# Patient Record
Sex: Female | Born: 1935 | Race: Black or African American | Hispanic: No | Smoking: Never smoker
Health system: Southern US, Community
[De-identification: ages and names within clinical notes are randomized; demographics above are authoritative.]

## PROBLEM LIST (undated history)

## (undated) DIAGNOSIS — I2510 Atherosclerotic heart disease of native coronary artery without angina pectoris: Secondary | ICD-10-CM

## (undated) DIAGNOSIS — F0390 Unspecified dementia without behavioral disturbance: Secondary | ICD-10-CM

## (undated) DIAGNOSIS — I10 Essential (primary) hypertension: Secondary | ICD-10-CM

## (undated) HISTORY — PX: CARDIAC CATHETERIZATION: SHX172

## (undated) HISTORY — PX: PEG TUBE PLACEMENT: SUR1034

---

## 2014-06-18 ENCOUNTER — Inpatient Hospital Stay (HOSPITAL_COMMUNITY)
Admission: AD | Admit: 2014-06-18 | Discharge: 2014-06-22 | DRG: 394 | Disposition: A | Discharge status: Skilled Nursing Facility | Payer: Medicare Other | Source: Other Acute Inpatient Hospital | Attending: Internal Medicine | Admitting: Internal Medicine

## 2014-06-18 DIAGNOSIS — Y833 Surgical operation with formation of external stoma as the cause of abnormal reaction of the patient, or of later complication, without mention of misadventure at the time of the procedure: Secondary | ICD-10-CM | POA: Diagnosis present

## 2014-06-18 DIAGNOSIS — Z7982 Long term (current) use of aspirin: Secondary | ICD-10-CM

## 2014-06-18 DIAGNOSIS — Z66 Do not resuscitate: Secondary | ICD-10-CM | POA: Diagnosis present

## 2014-06-18 DIAGNOSIS — D631 Anemia in chronic kidney disease: Secondary | ICD-10-CM | POA: Diagnosis present

## 2014-06-18 DIAGNOSIS — Z7401 Bed confinement status: Secondary | ICD-10-CM | POA: Diagnosis not present

## 2014-06-18 DIAGNOSIS — D649 Anemia, unspecified: Secondary | ICD-10-CM | POA: Diagnosis present

## 2014-06-18 DIAGNOSIS — F0391 Unspecified dementia with behavioral disturbance: Secondary | ICD-10-CM | POA: Diagnosis present

## 2014-06-18 DIAGNOSIS — Z01818 Encounter for other preprocedural examination: Secondary | ICD-10-CM

## 2014-06-18 DIAGNOSIS — F0390 Unspecified dementia without behavioral disturbance: Secondary | ICD-10-CM | POA: Diagnosis present

## 2014-06-18 DIAGNOSIS — N183 Chronic kidney disease, stage 3 (moderate): Secondary | ICD-10-CM | POA: Diagnosis present

## 2014-06-18 DIAGNOSIS — I129 Hypertensive chronic kidney disease with stage 1 through stage 4 chronic kidney disease, or unspecified chronic kidney disease: Secondary | ICD-10-CM | POA: Diagnosis present

## 2014-06-18 DIAGNOSIS — S30811A Abrasion of abdominal wall, initial encounter: Secondary | ICD-10-CM | POA: Diagnosis present

## 2014-06-18 DIAGNOSIS — M6240 Contracture of muscle, unspecified site: Secondary | ICD-10-CM | POA: Diagnosis present

## 2014-06-18 DIAGNOSIS — I2510 Atherosclerotic heart disease of native coronary artery without angina pectoris: Secondary | ICD-10-CM | POA: Diagnosis present

## 2014-06-18 DIAGNOSIS — E876 Hypokalemia: Secondary | ICD-10-CM | POA: Diagnosis not present

## 2014-06-18 DIAGNOSIS — Z79899 Other long term (current) drug therapy: Secondary | ICD-10-CM | POA: Diagnosis not present

## 2014-06-18 DIAGNOSIS — K9423 Gastrostomy malfunction: Secondary | ICD-10-CM

## 2014-06-18 DIAGNOSIS — N184 Chronic kidney disease, stage 4 (severe): Secondary | ICD-10-CM | POA: Diagnosis present

## 2014-06-18 DIAGNOSIS — Z4659 Encounter for fitting and adjustment of other gastrointestinal appliance and device: Secondary | ICD-10-CM

## 2014-06-18 DIAGNOSIS — E870 Hyperosmolality and hypernatremia: Secondary | ICD-10-CM | POA: Diagnosis present

## 2014-06-18 DIAGNOSIS — N189 Chronic kidney disease, unspecified: Secondary | ICD-10-CM | POA: Diagnosis present

## 2014-06-18 DIAGNOSIS — I10 Essential (primary) hypertension: Secondary | ICD-10-CM | POA: Diagnosis present

## 2014-06-18 HISTORY — DX: Unspecified dementia, unspecified severity, without behavioral disturbance, psychotic disturbance, mood disturbance, and anxiety: F03.90

## 2014-06-18 HISTORY — DX: Atherosclerotic heart disease of native coronary artery without angina pectoris: I25.10

## 2014-06-18 HISTORY — DX: Essential (primary) hypertension: I10

## 2014-06-18 NOTE — Progress Notes (Signed)
Pt new admit from [facility], pt came from SNF peg tube site leaking, MD aware waiting for new orders.

## 2014-06-19 ENCOUNTER — Inpatient Hospital Stay (HOSPITAL_COMMUNITY): Payer: Medicare Other

## 2014-06-19 ENCOUNTER — Encounter (HOSPITAL_COMMUNITY): Payer: Self-pay | Admitting: *Deleted

## 2014-06-19 DIAGNOSIS — D649 Anemia, unspecified: Secondary | ICD-10-CM

## 2014-06-19 DIAGNOSIS — I2510 Atherosclerotic heart disease of native coronary artery without angina pectoris: Secondary | ICD-10-CM | POA: Diagnosis present

## 2014-06-19 DIAGNOSIS — N189 Chronic kidney disease, unspecified: Secondary | ICD-10-CM | POA: Diagnosis present

## 2014-06-19 DIAGNOSIS — I10 Essential (primary) hypertension: Secondary | ICD-10-CM

## 2014-06-19 DIAGNOSIS — K9423 Gastrostomy malfunction: Principal | ICD-10-CM

## 2014-06-19 DIAGNOSIS — F0390 Unspecified dementia without behavioral disturbance: Secondary | ICD-10-CM | POA: Diagnosis present

## 2014-06-19 DIAGNOSIS — N183 Chronic kidney disease, stage 3 (moderate): Secondary | ICD-10-CM

## 2014-06-19 LAB — BASIC METABOLIC PANEL
Anion gap: 5 (ref 5–15)
BUN: 21 mg/dL (ref 6–23)
CO2: 31 mmol/L (ref 19–32)
Calcium: 8.7 mg/dL (ref 8.4–10.5)
Chloride: 110 mmol/L (ref 96–112)
Creatinine, Ser: 1.87 mg/dL — ABNORMAL HIGH (ref 0.50–1.10)
GFR calc Af Amer: 26 mL/min — ABNORMAL LOW (ref >90)
GFR calc non Af Amer: 23 mL/min — ABNORMAL LOW (ref >90)
Glucose, Bld: 99 mg/dL (ref 70–99)
POTASSIUM: 3.7 mmol/L (ref 3.5–5.1)
Sodium: 146 mmol/L — ABNORMAL HIGH (ref 135–145)

## 2014-06-19 LAB — COMPREHENSIVE METABOLIC PANEL
ALK PHOS: 47 U/L (ref 39–117)
ALT: 25 U/L (ref 0–35)
AST: 26 U/L (ref 0–37)
Albumin: 2.5 g/dL — ABNORMAL LOW (ref 3.5–5.2)
Anion gap: 4 — ABNORMAL LOW (ref 5–15)
BUN: 22 mg/dL (ref 6–23)
CHLORIDE: 112 mmol/L (ref 96–112)
CO2: 30 mmol/L (ref 19–32)
Calcium: 8.7 mg/dL (ref 8.4–10.5)
Creatinine, Ser: 1.87 mg/dL — ABNORMAL HIGH (ref 0.50–1.10)
GFR calc Af Amer: 26 mL/min — ABNORMAL LOW (ref >90)
GFR calc non Af Amer: 23 mL/min — ABNORMAL LOW (ref >90)
GLUCOSE: 101 mg/dL — AB (ref 70–99)
POTASSIUM: 3.8 mmol/L (ref 3.5–5.1)
Sodium: 146 mmol/L — ABNORMAL HIGH (ref 135–145)
Total Bilirubin: 0.6 mg/dL (ref 0.3–1.2)
Total Protein: 7 g/dL (ref 6.0–8.3)

## 2014-06-19 LAB — CBC WITH DIFFERENTIAL/PLATELET
BASOS ABS: 0 10*3/uL (ref 0.0–0.1)
Basophils Relative: 1 % (ref 0–1)
EOS ABS: 0.2 10*3/uL (ref 0.0–0.7)
EOS PCT: 4 % (ref 0–5)
HCT: 26.6 % — ABNORMAL LOW (ref 36.0–46.0)
Hemoglobin: 8.1 g/dL — ABNORMAL LOW (ref 12.0–15.0)
LYMPHS ABS: 1.6 10*3/uL (ref 0.7–4.0)
LYMPHS PCT: 32 % (ref 12–46)
MCH: 28.5 pg (ref 26.0–34.0)
MCHC: 30.5 g/dL (ref 30.0–36.0)
MCV: 93.7 fL (ref 78.0–100.0)
Monocytes Absolute: 0.4 10*3/uL (ref 0.1–1.0)
Monocytes Relative: 7 % (ref 3–12)
Neutro Abs: 2.8 10*3/uL (ref 1.7–7.7)
Neutrophils Relative %: 56 % (ref 43–77)
Platelets: 185 10*3/uL (ref 150–400)
RBC: 2.84 MIL/uL — AB (ref 3.87–5.11)
RDW: 14.4 % (ref 11.5–15.5)
WBC: 5 10*3/uL (ref 4.0–10.5)

## 2014-06-19 LAB — RETICULOCYTES
RBC.: 2.8 MIL/uL — AB (ref 3.87–5.11)
Retic Count, Absolute: 67.2 10*3/uL (ref 19.0–186.0)
Retic Ct Pct: 2.4 % (ref 0.4–3.1)

## 2014-06-19 LAB — CBC
HCT: 25.9 % — ABNORMAL LOW (ref 36.0–46.0)
Hemoglobin: 7.9 g/dL — ABNORMAL LOW (ref 12.0–15.0)
MCH: 28.3 pg (ref 26.0–34.0)
MCHC: 30.5 g/dL (ref 30.0–36.0)
MCV: 92.8 fL (ref 78.0–100.0)
Platelets: 194 10*3/uL (ref 150–400)
RBC: 2.79 MIL/uL — ABNORMAL LOW (ref 3.87–5.11)
RDW: 14.2 % (ref 11.5–15.5)
WBC: 4.8 10*3/uL (ref 4.0–10.5)

## 2014-06-19 LAB — TYPE AND SCREEN
ABO/RH(D): A POS
ANTIBODY SCREEN: NEGATIVE

## 2014-06-19 LAB — PROTIME-INR
INR: 1.02 (ref 0.00–1.49)
Prothrombin Time: 13.5 seconds (ref 11.6–15.2)

## 2014-06-19 LAB — GLUCOSE, CAPILLARY
Glucose-Capillary: 112 mg/dL — ABNORMAL HIGH (ref 70–99)
Glucose-Capillary: 112 mg/dL — ABNORMAL HIGH (ref 70–99)

## 2014-06-19 LAB — ABO/RH: ABO/RH(D): A POS

## 2014-06-19 MED ORDER — ACETAMINOPHEN 325 MG PO TABS: 650.0000 mg | ORAL_TABLET | Freq: Four times a day (QID) | ORAL | Status: DC | PRN

## 2014-06-19 MED ORDER — ACETAMINOPHEN 650 MG RE SUPP: 650.0000 mg | Freq: Four times a day (QID) | RECTAL | Status: DC | PRN

## 2014-06-19 MED ORDER — HEPARIN SODIUM (PORCINE) 5000 UNIT/ML IJ SOLN
5000.0000 [IU] | Freq: Three times a day (TID) | INTRAMUSCULAR | Status: DC
  Administered 2014-06-19 – 2014-06-20 (×4): 5000 [IU] via SUBCUTANEOUS
  Filled 2014-06-19 (×8): qty 1

## 2014-06-19 MED ORDER — ONDANSETRON HCL 4 MG PO TABS: 4.0000 mg | ORAL_TABLET | Freq: Four times a day (QID) | ORAL | Status: DC | PRN

## 2014-06-19 MED ORDER — ONDANSETRON HCL 4 MG/2ML IJ SOLN: 4.0000 mg | Freq: Four times a day (QID) | INTRAMUSCULAR | Status: DC | PRN

## 2014-06-19 MED ORDER — ASPIRIN 325 MG PO TABS
325.0000 mg | ORAL_TABLET | Freq: Every day | ORAL | Status: DC
  Administered 2014-06-19 – 2014-06-20 (×2): 325 mg
  Filled 2014-06-19 (×2): qty 1

## 2014-06-19 MED ORDER — HYDRALAZINE HCL 20 MG/ML IJ SOLN: 10.0000 mg | INTRAMUSCULAR | Status: DC | PRN

## 2014-06-19 MED ORDER — METOPROLOL TARTRATE 25 MG PO TABS
25.0000 mg | ORAL_TABLET | Freq: Two times a day (BID) | ORAL | Status: DC
  Administered 2014-06-19 – 2014-06-22 (×7): 25 mg via ORAL
  Filled 2014-06-19 (×8): qty 1

## 2014-06-19 MED ORDER — NEPRO/CARBSTEADY PO LIQD
1000.0000 mL | ORAL | Status: DC
  Administered 2014-06-19 – 2014-06-21 (×4): 1000 mL
  Filled 2014-06-19 (×7): qty 1000

## 2014-06-19 MED ORDER — DORZOLAMIDE HCL 2 % OP SOLN
1.0000 [drp] | Freq: Three times a day (TID) | OPHTHALMIC | Status: DC
  Administered 2014-06-19 – 2014-06-22 (×10): 1 [drp] via OPHTHALMIC
  Filled 2014-06-19 (×2): qty 10

## 2014-06-19 MED ORDER — CETYLPYRIDINIUM CHLORIDE 0.05 % MT LIQD
7.0000 mL | Freq: Two times a day (BID) | OROMUCOSAL | Status: DC
  Administered 2014-06-19 – 2014-06-22 (×6): 7 mL via OROMUCOSAL

## 2014-06-19 MED ORDER — DEXTROSE-NACL 5-0.45 % IV SOLN
INTRAVENOUS | Status: AC
  Administered 2014-06-19 (×2): via INTRAVENOUS

## 2014-06-19 NOTE — Progress Notes (Signed)
Pt refused to let me examine her at this time.  Service will check back tomorrow. Very combative and angry.

## 2014-06-19 NOTE — Progress Notes (Signed)
Patient Demographics  Kristin Dickson, is a 81 y.o. female, DOB - 12-21-1922, ZOX:[id number]  Admit date - 06/18/2014   Admitting Physician Dorothea Ogle, MD  Outpatient Primary MD for the patient is FALGUI,VICENTE T, MD  LOS - 1      Admission history of present illness/brief narrative:  Kristin Dickson is a 81 y.o. female with history of mild dementia, CAD, chronic kidney disease, hypertension, chronic anemia, hypernatremia, was originally admitted at [facility] for PEG tube displacement and had a new PEG tube placed which was found to have a leak and eventually GI was consulted and a J-tube was placed. Despite which patient  was still found to have lots of gastric leakage and at this time was referred to [facility] for further management of the gastric tube leakage and wound around the gastric tube site which may need new gastric tube site placement. Apparently on x-ray patient has G- J tube, were tube feedings going through the G part, and G were involved is on drainage with gravity.  Subjective:   Kristin Dickson patient is noncommunicative, does not answer questions.  Assessment & Plan    Principal Problem:   Leaking percutaneous endoscopic gastrostomy (PEG) tube Active Problems:   Essential hypertension   CAD (coronary artery disease)   CKD (chronic kidney disease)   Chronic anemia   Dementia   Leaking PEG tube  Leaking PEG tube: - Consulted surgical service. -We'll consult Wound Care for evaluation for surrounding excoriation at the site  Hypertension - Continue with metoprolol and when necessary IV hydralazine  Chronic anemia - Most likely due to anemia of chronic kidney disease - Check anemia panel  Chronic kidney disease stage III - Monitor BMP closely  Coronary artery disease - Continue with  aspirin  Dementia  Code Status: DO NOT RESUSCITATE  Family Communication: None at bedside  Disposition Plan: Awaiting surgical evaluation   Procedures None   Consults   None   Medications  Scheduled Meds: . antiseptic oral rinse  7 mL Mouth Rinse BID  . aspirin  325 mg Per Tube Daily  . dorzolamide  1 drop Both Eyes TID  . heparin  5,000 Units Subcutaneous 3 times per day  . metoprolol tartrate  25 mg Oral BID   Continuous Infusions: . dextrose 5 % and 0.45% NaCl 50 mL/hr at 06/19/14 0212  . feeding supplement (NEPRO CARB STEADY) 1,000 mL (06/19/14 0212)   PRN Meds:.acetaminophen **OR** acetaminophen, hydrALAZINE, ondansetron **OR** ondansetron (ZOFRAN) IV  DVT Prophylaxis  heparin  Lab Results  Component Value Date   PLT 185 06/19/2014   PLT 194 06/19/2014    Antibiotics    Anti-infectives    None          Objective:   Filed Vitals:   06/18/14 2200 06/19/14 1315  BP: 134/79 146/64  Pulse: 101 84  Temp: 97.7 F (36.5 C) 98.6 F (37 C)  TempSrc: Axillary Oral  Resp: 16 17  Height:  (1.6 m)   Weight: 62.823 kg (138 lb 8 oz)   SpO2: 92% 100%    Wt Readings from Last 3 Encounters:  06/18/14 62.823 kg (138 lb 8 oz)     Intake/Output Summary (Last 24 hours)  at 06/19/14 1449 Last data filed at 06/19/14 0600  Gross per 24 hour  Intake    311 ml  Output      0 ml  Net    311 ml     Physical Exam  Awake , noncommunicative  Supple Neck,No JVD, No cervical lymphadenopathy appriciated.  Symmetrical Chest wall movement, Good air movement bilaterally, CTAB No Gallops,Rubs or new Murmurs, No Parasternal Heave +ve B.Sounds, Abd Soft, No tenderness, No organomegaly appriciated, No rebound - guarding or rigidity, PEG tube present, no discharge from site Grapelandhomas gauze has been changed 2 hours ago, is related skin around PEG tube. No Cyanosis, Clubbing or edema, No new Rash or bruise     Data Review   Micro Results No results found for  this or any previous visit (from the past 240 hour(s)).  Radiology Reports Dg Abd 1 View  06/19/2014   CLINICAL DATA:  adjustment of gastrointestinal device. Initial encounter.  EXAM: ABDOMEN - 1 VIEW  COMPARISON:  None.  FINDINGS: There appears to be a percutaneous enteric tube which has a course from the region of the stomach, through the doudenal C loop and terminating in the right abdomen, presumably in small bowel.  There is oral contrast mixed with stool. Non obstructed bowel gas pattern. Two IVC filters are in place. Negative lung bases. No acute osseous abnormality identified.  IMPRESSION: 1. Enteric tubing favored to be a percutaneous G-J tube, coursing through the duodenum and terminating in the proximal small bowel in the right abdomen.  2.  Non obstructed bowel gas pattern.   Electronically Signed   By: Odessa FlemingH  Hall M.D.   On: 06/19/2014 02:24   Dg Chest Port 1 View  06/19/2014   CLINICAL DATA:  Pre-op respiratory exam. Coronary artery disease. Chronic kidney disease. Leaking gastrostomy tube.  EXAM: PORTABLE CHEST - 1 VIEW  COMPARISON:  None.  FINDINGS: Position of the patient's head obscures the right upper lung field. Lungs are otherwise clear. No evidence of pleural effusion. Heart size is within normal limits.  IMPRESSION: Suboptimal visualization of right upper lung field due to positioning. No acute findings.   Electronically Signed   By: Myles RosenthalJohn  Stahl M.D.   On: 06/19/2014 11:08    CBC  Recent Labs Lab 06/19/14 0234  WBC 4.8  5.0  HGB 7.9*  8.1*  HCT 25.9*  26.6*  PLT 194  185  MCV 92.8  93.7  MCH 28.3  28.5  MCHC 30.5  30.5  RDW 14.2  14.4  LYMPHSABS 1.6  MONOABS 0.4  EOSABS 0.2  BASOSABS 0.0    Chemistries   Recent Labs Lab 06/19/14 0234  NA 146*  146*  K 3.8  3.7  CL 112  110  CO2 30  31  GLUCOSE 101*  99  BUN 22  21  CREATININE 1.87*  1.87*  CALCIUM 8.7  8.7  AST 26  ALT 25  ALKPHOS 47  BILITOT 0.6    ------------------------------------------------------------------------------------------------------------------ estimated creatinine clearance is 16.9 mL/min (by C-G formula based on Cr of 1.87). ------------------------------------------------------------------------------------------------------------------ No results for input(s): HGBA1C in the last 72 hours. ------------------------------------------------------------------------------------------------------------------ No results for input(s): CHOL, HDL, LDLCALC, TRIG, CHOLHDL, LDLDIRECT in the last 72 hours. ------------------------------------------------------------------------------------------------------------------ No results for input(s): TSH, T4TOTAL, T3FREE, THYROIDAB in the last 72 hours.  Invalid input(s): FREET3 ------------------------------------------------------------------------------------------------------------------ No results for input(s): VITAMINB12, FOLATE, FERRITIN, TIBC, IRON, RETICCTPCT in the last 72 hours.  Coagulation profile  Recent Labs Lab 06/19/14 0234  INR 1.02  No results for input(s): DDIMER in the last 72 hours.  Cardiac Enzymes No results for input(s): CKMB, TROPONINI, MYOGLOBIN in the last 168 hours.  Invalid input(s): CK ------------------------------------------------------------------------------------------------------------------ Invalid input(s): POCBNP     Time Spent in minutes   30 minutes   Miesha Bachmann M.D on 06/19/2014 at 2:49 PM  Between 7am to 7pm - Pager - [phone]  After 7pm go to [email or web address] - password [email or web address]  And look for the night coverage person covering for me after hours  [facility] Office  [phone]   **Disclaimer: This note may have been dictated with voice recognition software. Similar sounding words can inadvertently be transcribed and this note may contain transcription errors which may not have been corrected  upon publication of note.**

## 2014-06-19 NOTE — Progress Notes (Signed)
A moderate amount of  Fluid  at times clear and at times bile looking noted leaking around the G-J tube.

## 2014-06-19 NOTE — H&P (Signed)
[facility] History and Physical  Kristin Birkenheadancy Crandall BJY:[id number] DOB: 10/09/1922 DOA: 06/18/2014  Referring physician: Patient was accepted by Dr. Danie BinderIskra Myers from [facility]. PCP: Leone PayorFALGUI,VICENTE T, MD   Chief Complaint: Leaking PEG tube site.  HPI: Kristin Dickson is a 81 y.o. female with history of mild dementia, CAD, chronic kidney disease, hypertension, chronic anemia, hypernatremia, was originally admitted at [facility] for PEG tube displacement and had a new PEG tube placed which was found to have a leak and eventually GI was consulted and a J-tube was placed. Despite which agent was still found to have lots of gastric leakage and at this time was referred to [facility] for further management of the gastric tube leakage and wound around the gastric tube site which may need new gastric tube site placement. Patient on exam is demented and does not contribute much to history. Most of the history was obtained from patient's chart. As per the discharge summary provided in the chart patient had surgical consult done at [facility] and was found not a candidate for surgery.  Review of Systems: As presented in the history of presenting illness, rest negative.  Past Medical History  Diagnosis Date  . Coronary artery disease   . Hypertension   . Dementia    Past Surgical History  Procedure Laterality Date  . Cardiac catheterization    . Peg tube placement     Social History:  reports that she has never smoked. She does not have any smokeless tobacco history on file. She reports that she does not drink alcohol or use illicit drugs. Where does patient live nursing home. Can patient participate in ADLs? No.  Not on File  Family History:  Family History  Problem Relation Age of Onset  . Family history unknown: Yes      Prior to Admission medications   Not on File    Physical Exam: Filed Vitals:   06/18/14 2200   BP: 134/79  Pulse: 101  Temp: 97.7 F (36.5 C)  TempSrc: Axillary  Resp: 16  Height: 5\' 3"  (1.6 m)  Weight: 62.823 kg (138 lb 8 oz)  SpO2: 92%     General:  Poorly built and nourished.  Eyes: Anicteric no pallor.  ENT: No discharge from the ears eyes nose and mouth.  Neck: No mass felt.  Cardiovascular: S1 and S2 heard.  Respiratory: No rhonchi or crepitations.  Abdomen: PEG tube seen with surrounding excoriated skin.  Skin: Skin excoriated around the PEG tube site.  Musculoskeletal: No edema. Heel ulcers.  Psychiatric: Patient is demented.  Neurologic: Patient is demented and does not follow commands.  Labs on Admission:  Basic Metabolic Panel: No results for input(s): NA, K, CL, CO2, GLUCOSE, BUN, CREATININE, CALCIUM, MG, PHOS in the last 168 hours. Liver Function Tests: No results for input(s): AST, ALT, ALKPHOS, BILITOT, PROT, ALBUMIN in the last 168 hours. No results for input(s): LIPASE, AMYLASE in the last 168 hours. No results for input(s): AMMONIA in the last 168 hours. CBC: No results for input(s): WBC, NEUTROABS, HGB, HCT, MCV, PLT in the last 168 hours. Cardiac Enzymes: No results for input(s): CKTOTAL, CKMB, CKMBINDEX, TROPONINI in the last 168 hours.  BNP (last 3 results) No results for input(s): BNP in the last 8760 hours.  ProBNP (last 3 results) No results for input(s): PROBNP in the last 8760 hours.  CBG: No results for input(s): GLUCAP in the last 168 hours.  Radiological Exams on  Admission: No results found.   Assessment/Plan Principal Problem:   Leaking percutaneous endoscopic gastrostomy (PEG) tube Active Problems:   Essential hypertension   CAD (coronary artery disease)   CKD (chronic kidney disease)   Chronic anemia   Dementia   Leaking PEG tube   1. Leaking PEG tube site with surrounding skin excoriation - at this time x-ray of the abdomen and chest has been ordered. Patient is on Nepro diet. Consult GI in a.m. for  further recommendation with regarding to patient leaking PEG tube. Consult wound team for surrounding excoriation around the PEG tube. 2. Hypertension - patient on metoprolol and when necessary IV hydralazine. 3. Chronic anemia probably from chronic kidney disease - follow CBC which is pending. 4. Chronic kidney disease stage III - follow metabolic panel. 5. CAD - on antiplatelet agent. 6. Dementia.  Patient's labs are all pending. Palliative team care consult for goals of care.  DVT Prophylaxis heparin.  Code Status: DO NOT RESUSCITATE.  Family Communication: None.  Disposition Plan: Admit to inpatient.    Kristin Dickson. [facility] Pager [phone].  If 7PM-7AM, please contact night-coverage [email or web address] Password [email or web address] 06/19/2014, 1:51 AM

## 2014-06-19 NOTE — Progress Notes (Signed)
TUBE FEED ASSESSMENT  DOCUMENTATION CODES Per approved criteria  -Not Applicable   INTERVENTION: -Continue TF pt came from SNF on: Nepro @ 45 mls which provides 1944 kcals, 87.5 g Pro, 785 mls Water  Admitting Dx: Leaking percutaneous endoscopic gastrostomy (PEG) tube  ASSESSMENT: 81 y.o. female with history of mild dementia, CAD, chronic kidney disease, hypertension, chronic anemia, hypernatremia. Had PEG leakage, New J-tube was placed  Height: Ht Readings from Last 1 Encounters:  06/18/14 5\' 3"  (1.6 m)    Weight: Wt Readings from Last 1 Encounters:  06/18/14 138 lb 8 oz (62.823 kg)    Ideal Body Weight: 115  % Ideal Body Weight: 120%  Wt Readings from Last 10 Encounters:  06/18/14 138 lb 8 oz (62.823 kg)    BMI:  Body mass index is 24.54 kg/(m^2).  Estimated Nutritional Needs: Kcal: 1350-1550 (22-25 kcal/kg) Protein:>94 g Fluid: 1.3-1.5 liters  Skin: PU unstageable- L heel  PU stage 2- R heel  Diet Order:  NPO  EDUCATION NEEDS: -No education needs identified at this time   Intake/Output Summary (Last 24 hours) at 06/19/14 1523 Last data filed at 06/19/14 0600  Gross per 24 hour  Intake    311 ml  Output      0 ml  Net    311 ml    Last BM: 2/13  Labs:   Recent Labs Lab 06/19/14 0234  NA 146*  146*  K 3.8  3.7  CL 112  110  CO2 30  31  BUN 22  21  CREATININE 1.87*  1.87*  CALCIUM 8.7  8.7  GLUCOSE 101*  99    CBG (last 3)   Recent Labs  06/19/14 1155  GLUCAP 112*    Scheduled Meds: . antiseptic oral rinse  7 mL Mouth Rinse BID  . aspirin  325 mg Per Tube Daily  . dorzolamide  1 drop Both Eyes TID  . heparin  5,000 Units Subcutaneous 3 times per day  . metoprolol tartrate  25 mg Oral BID    Continuous Infusions: . dextrose 5 % and 0.45% NaCl 50 mL/hr at 06/19/14 0212  . feeding supplement (NEPRO CARB STEADY) 1,000 mL (06/19/14 [id number])    Past Medical History  Diagnosis Date  . Coronary artery disease   .  Hypertension   . Dementia     Past Surgical History  Procedure Laterality Date  . Cardiac catheterization    . Peg tube placement      Christophe LouisNathan Tekisha Darcey RD, LDN Nutrition Pager: [phone]  06/19/2014 3:23 PM

## 2014-06-20 ENCOUNTER — Encounter (HOSPITAL_COMMUNITY): Payer: Self-pay | Admitting: General Surgery

## 2014-06-20 DIAGNOSIS — F0390 Unspecified dementia without behavioral disturbance: Secondary | ICD-10-CM

## 2014-06-20 DIAGNOSIS — D631 Anemia in chronic kidney disease: Secondary | ICD-10-CM

## 2014-06-20 DIAGNOSIS — E876 Hypokalemia: Secondary | ICD-10-CM | POA: Diagnosis not present

## 2014-06-20 DIAGNOSIS — N189 Chronic kidney disease, unspecified: Secondary | ICD-10-CM

## 2014-06-20 DIAGNOSIS — D649 Anemia, unspecified: Secondary | ICD-10-CM | POA: Diagnosis present

## 2014-06-20 DIAGNOSIS — N184 Chronic kidney disease, stage 4 (severe): Secondary | ICD-10-CM

## 2014-06-20 LAB — GLUCOSE, CAPILLARY
GLUCOSE-CAPILLARY: 100 mg/dL — AB (ref 70–99)
Glucose-Capillary: 106 mg/dL — ABNORMAL HIGH (ref 70–99)
Glucose-Capillary: 108 mg/dL — ABNORMAL HIGH (ref 70–99)
Glucose-Capillary: 119 mg/dL — ABNORMAL HIGH (ref 70–99)

## 2014-06-20 LAB — CBC
HCT: 25.2 % — ABNORMAL LOW (ref 36.0–46.0)
Hemoglobin: 7.6 g/dL — ABNORMAL LOW (ref 12.0–15.0)
MCH: 28.3 pg (ref 26.0–34.0)
MCHC: 30.2 g/dL (ref 30.0–36.0)
MCV: 93.7 fL (ref 78.0–100.0)
Platelets: 216 10*3/uL (ref 150–400)
RBC: 2.69 MIL/uL — ABNORMAL LOW (ref 3.87–5.11)
RDW: 14.6 % (ref 11.5–15.5)
WBC: 5.3 10*3/uL (ref 4.0–10.5)

## 2014-06-20 LAB — BASIC METABOLIC PANEL
Anion gap: 10 (ref 5–15)
BUN: 32 mg/dL — ABNORMAL HIGH (ref 6–23)
CALCIUM: 8.7 mg/dL (ref 8.4–10.5)
CO2: 27 mmol/L (ref 19–32)
CREATININE: 2.06 mg/dL — AB (ref 0.50–1.10)
Chloride: 107 mmol/L (ref 96–112)
GFR, EST AFRICAN AMERICAN: 23 mL/min — AB (ref >90)
GFR, EST NON AFRICAN AMERICAN: 20 mL/min — AB (ref >90)
Glucose, Bld: 105 mg/dL — ABNORMAL HIGH (ref 70–99)
POTASSIUM: 3.1 mmol/L — AB (ref 3.5–5.1)
Sodium: 144 mmol/L (ref 135–145)

## 2014-06-20 LAB — IRON AND TIBC
IRON: 59 ug/dL (ref 42–145)
SATURATION RATIOS: 31 % (ref 20–55)
TIBC: 188 ug/dL — ABNORMAL LOW (ref 250–470)
UIBC: 129 ug/dL (ref 125–400)

## 2014-06-20 LAB — VITAMIN B12: VITAMIN B 12: 1954 pg/mL — AB (ref 211–911)

## 2014-06-20 LAB — MAGNESIUM: Magnesium: 2.3 mg/dL (ref 1.5–2.5)

## 2014-06-20 LAB — FOLATE: Folate: >20 ng/mL

## 2014-06-20 LAB — FERRITIN: FERRITIN: 434 ng/mL — AB (ref 10–291)

## 2014-06-20 MED ORDER — METOCLOPRAMIDE HCL 5 MG/5ML PO SOLN
5.0000 mg | Freq: Four times a day (QID) | ORAL | Status: DC
  Administered 2014-06-20 – 2014-06-22 (×8): 5 mg via ORAL
  Filled 2014-06-20 (×12): qty 5

## 2014-06-20 MED ORDER — DEXTROSE-NACL 5-0.45 % IV SOLN
INTRAVENOUS | Status: AC
  Administered 2014-06-20 – 2014-06-21 (×3): via INTRAVENOUS

## 2014-06-20 MED ORDER — POTASSIUM CHLORIDE 20 MEQ/15ML (10%) PO SOLN
40.0000 meq | Freq: Four times a day (QID) | ORAL | Status: AC
  Administered 2014-06-20 – 2014-06-21 (×6): 40 meq
  Filled 2014-06-20 (×9): qty 30

## 2014-06-20 NOTE — Consult Note (Signed)
Reason for Consult: leaking gastrostomy tube Referring Physician: Dr. Storm Frisk    HPI: Kristin Dickson is a 81 year old female with a history of dementia, CAD, HTN, anemia, CKD transferred from [facility] for a leaking PEG tube.  The patient was unable to provide any history.  Apparently a PEG was placed and eventually replaced due to leaking.  Then a GJ tube was placed, however it continued to leak and therefore was transferred to [facility] for further management.  Presently, tube feeds are at 37m/hr.   Past Medical History  Diagnosis Date  . Coronary artery disease   . Hypertension   . Dementia     Past Surgical History  Procedure Laterality Date  . Cardiac catheterization    . Peg tube placement      Family History  Problem Relation Age of Onset  . Family history unknown: Yes    Social History:  reports that she has never smoked. She does not have any smokeless tobacco history on file. She reports that she does not drink alcohol or use illicit drugs.  Allergies: No Known Allergies  Medications:  Prior to Admission medications   Medication Sig Start Date End Date Taking? Authorizing Provider  aspirin 81 MG tablet Take 81 mg by mouth daily.   Yes Historical Provider, MD  Aspirin-Salicylamide-Caffeine (BC HEADACHE POWDER PO) Take 1 packet by mouth daily as needed. For headache   Yes Historical Provider, MD     Results for orders placed or performed during the hospital encounter of 06/18/14 (from the past 48 hour(s))  Comprehensive metabolic panel     Status: Abnormal   Collection Time: 06/19/14  2:34 AM  Result Value Ref Range   Sodium 146 (H) 135 - 145 mmol/L   Potassium 3.8 3.5 - 5.1 mmol/L   Chloride 112 96 - 112 mmol/L   CO2 30 19 - 32 mmol/L   Glucose, Bld 101 (H) 70 - 99 mg/dL   BUN 22 6 - 23 mg/dL   Creatinine, Ser 1.87 (H) 0.50 - 1.10 mg/dL   Calcium 8.7 8.4 - 10.5 mg/dL   Total Protein 7.0 6.0 - 8.3 g/dL   Albumin 2.5 (L) 3.5 - 5.2  g/dL   AST 26 0 - 37 U/L   ALT 25 0 - 35 U/L   Alkaline Phosphatase 47 39 - 117 U/L   Total Bilirubin 0.6 0.3 - 1.2 mg/dL   GFR calc non Af Amer 23 (L) >90 mL/min   GFR calc Af Amer 26 (L) >90 mL/min    Comment: (NOTE) The eGFR has been calculated using the CKD EPI equation. This calculation has not been validated in all clinical situations. eGFR's persistently <90 mL/min signify possible Chronic Kidney Disease.    Anion gap 4 (L) 5 - 15  CBC with Differential/Platelet     Status: Abnormal   Collection Time: 06/19/14  2:34 AM  Result Value Ref Range   WBC 5.0 4.0 - 10.5 K/uL   RBC 2.84 (L) 3.87 - 5.11 MIL/uL   Hemoglobin 8.1 (L) 12.0 - 15.0 g/dL   HCT 26.6 (L) 36.0 - 46.0 %   MCV 93.7 78.0 - 100.0 fL   MCH 28.5 26.0 - 34.0 pg   MCHC 30.5 30.0 - 36.0 g/dL   RDW 14.4 11.5 - 15.5 %   Platelets 185 150 - 400 K/uL   Neutrophils Relative % 56 43 - 77 %   Neutro Abs 2.8 1.7 - 7.7 K/uL  Lymphocytes Relative 32 12 - 46 %   Lymphs Abs 1.6 0.7 - 4.0 K/uL   Monocytes Relative 7 3 - 12 %   Monocytes Absolute 0.4 0.1 - 1.0 K/uL   Eosinophils Relative 4 0 - 5 %   Eosinophils Absolute 0.2 0.0 - 0.7 K/uL   Basophils Relative 1 0 - 1 %   Basophils Absolute 0.0 0.0 - 0.1 K/uL  Type and screen     Status: None   Collection Time: 06/19/14  2:34 AM  Result Value Ref Range   ABO/RH(D) A POS    Antibody Screen NEG    Sample Expiration 06/22/2014   Protime-INR     Status: None   Collection Time: 06/19/14  2:34 AM  Result Value Ref Range   Prothrombin Time 13.5 11.6 - 15.2 seconds   INR 1.02 0.00 - 5.68  Basic metabolic panel     Status: Abnormal   Collection Time: 06/19/14  2:34 AM  Result Value Ref Range   Sodium 146 (H) 135 - 145 mmol/L   Potassium 3.7 3.5 - 5.1 mmol/L   Chloride 110 96 - 112 mmol/L   CO2 31 19 - 32 mmol/L   Glucose, Bld 99 70 - 99 mg/dL   BUN 21 6 - 23 mg/dL   Creatinine, Ser 1.87 (H) 0.50 - 1.10 mg/dL   Calcium 8.7 8.4 - 10.5 mg/dL   GFR calc non Af Amer 23 (L)  >90 mL/min   GFR calc Af Amer 26 (L) >90 mL/min    Comment: (NOTE) The eGFR has been calculated using the CKD EPI equation. This calculation has not been validated in all clinical situations. eGFR's persistently <90 mL/min signify possible Chronic Kidney Disease.    Anion gap 5 5 - 15  CBC     Status: Abnormal   Collection Time: 06/19/14  2:34 AM  Result Value Ref Range   WBC 4.8 4.0 - 10.5 K/uL   RBC 2.79 (L) 3.87 - 5.11 MIL/uL   Hemoglobin 7.9 (L) 12.0 - 15.0 g/dL   HCT 25.9 (L) 36.0 - 46.0 %   MCV 92.8 78.0 - 100.0 fL   MCH 28.3 26.0 - 34.0 pg   MCHC 30.5 30.0 - 36.0 g/dL   RDW 14.2 11.5 - 15.5 %   Platelets 194 150 - 400 K/uL  ABO/Rh     Status: None   Collection Time: 06/19/14  2:34 AM  Result Value Ref Range   ABO/RH(D) A POS   Glucose, capillary     Status: Abnormal   Collection Time: 06/19/14 11:55 AM  Result Value Ref Range   Glucose-Capillary 112 (H) 70 - 99 mg/dL  Reticulocytes     Status: Abnormal   Collection Time: 06/19/14  4:18 PM  Result Value Ref Range   Retic Ct Pct 2.4 0.4 - 3.1 %   RBC. 2.80 (L) 3.87 - 5.11 MIL/uL   Retic Count, Manual 67.2 19.0 - 186.0 K/uL  Glucose, capillary     Status: Abnormal   Collection Time: 06/19/14  5:54 PM  Result Value Ref Range   Glucose-Capillary 112 (H) 70 - 99 mg/dL  Glucose, capillary     Status: Abnormal   Collection Time: 06/20/14 12:08 AM  Result Value Ref Range   Glucose-Capillary 106 (H) 70 - 99 mg/dL   Comment 1 Notify RN    Comment 2 Documented in Char   Glucose, capillary     Status: Abnormal   Collection Time: 06/20/14  5:30 AM  Result Value Ref Range   Glucose-Capillary 119 (H) 70 - 99 mg/dL  CBC     Status: Abnormal   Collection Time: 06/20/14  6:43 AM  Result Value Ref Range   WBC 5.3 4.0 - 10.5 K/uL   RBC 2.69 (L) 3.87 - 5.11 MIL/uL   Hemoglobin 7.6 (L) 12.0 - 15.0 g/dL   HCT 25.2 (L) 36.0 - 46.0 %   MCV 93.7 78.0 - 100.0 fL   MCH 28.3 26.0 - 34.0 pg   MCHC 30.2 30.0 - 36.0 g/dL   RDW 14.6  11.5 - 15.5 %   Platelets 216 150 - 400 K/uL  Basic metabolic panel     Status: Abnormal   Collection Time: 06/20/14  6:43 AM  Result Value Ref Range   Sodium 144 135 - 145 mmol/L   Potassium 3.1 (L) 3.5 - 5.1 mmol/L   Chloride 107 96 - 112 mmol/L   CO2 27 19 - 32 mmol/L   Glucose, Bld 105 (H) 70 - 99 mg/dL   BUN 32 (H) 6 - 23 mg/dL   Creatinine, Ser 2.06 (H) 0.50 - 1.10 mg/dL   Calcium 8.7 8.4 - 10.5 mg/dL   GFR calc non Af Amer 20 (L) >90 mL/min   GFR calc Af Amer 23 (L) >90 mL/min    Comment: (NOTE) The eGFR has been calculated using the CKD EPI equation. This calculation has not been validated in all clinical situations. eGFR's persistently <90 mL/min signify possible Chronic Kidney Disease.    Anion gap 10 5 - 15    Dg Abd 1 View  06/19/2014   CLINICAL DATA:  adjustment of gastrointestinal device. Initial encounter.  EXAM: ABDOMEN - 1 VIEW  COMPARISON:  None.  FINDINGS: There appears to be a percutaneous enteric tube which has a course from the region of the stomach, through the doudenal C loop and terminating in the right abdomen, presumably in small bowel.  There is oral contrast mixed with stool. Non obstructed bowel gas pattern. Two IVC filters are in place. Negative lung bases. No acute osseous abnormality identified.  IMPRESSION: 1. Enteric tubing favored to be a percutaneous G-J tube, coursing through the duodenum and terminating in the proximal small bowel in the right abdomen.  2.  Non obstructed bowel gas pattern.   Electronically Signed   By: Genevie Ann M.D.   On: 06/19/2014 02:24   Dg Chest Port 1 View  06/19/2014   CLINICAL DATA:  Pre-op respiratory exam. Coronary artery disease. Chronic kidney disease. Leaking gastrostomy tube.  EXAM: PORTABLE CHEST - 1 VIEW  COMPARISON:  None.  FINDINGS: Position of the patient's head obscures the right upper lung field. Lungs are otherwise clear. No evidence of pleural effusion. Heart size is within normal limits.  IMPRESSION: Suboptimal  visualization of right upper lung field due to positioning. No acute findings.   Electronically Signed   By: Earle Gell M.D.   On: 06/19/2014 11:08    Review of Systems  Unable to perform ROS  Blood pressure 140/50, pulse 92, temperature 98.8 F (37.1 C), temperature source Axillary, resp. rate 15, height 5' 3"  (1.6 m), weight 138 lb 8 oz (62.823 kg), SpO2 100 %. Physical Exam  Constitutional: She appears well-developed and well-nourished. No distress.  Cardiovascular: Normal rate, regular rhythm, normal heart sounds and intact distal pulses.  Exam reveals no gallop and no friction rub.   No murmur heard. Respiratory: Effort normal and breath sounds normal. No respiratory distress. She has  no wheezes. She has no rales. She exhibits no tenderness.  GI: Soft. Bowel sounds are normal. She exhibits no distension and no mass. There is no tenderness. There is no rebound and no guarding.  Percutaneous G-J tube in place, minimal surrounding drainage, gastric content, NOT tube feedings.  Right aspect surrounding the tube with skin breakdown without evidence of cellulitis.   Musculoskeletal: She exhibits no edema.  Skin: Skin is warm and dry. She is not diaphoretic.    Assessment/Plan: Leaking percutaneous GJ tube No surgical indications.  Would recommend GI input to perhaps exchange the feedings tube.  Local wound care to help with skin breakdown.    Rosaisela Jamroz ANP-BC 06/20/2014, 9:28 AM

## 2014-06-20 NOTE — Progress Notes (Addendum)
Patient Demographics  Kristin Dickson, is a 81 y.o. female, DOB - 01/14/1923, ZOX:[id number]  Admit date - 06/18/2014   Admitting Physician Dorothea OgleIskra M Myers, MD  Outpatient Primary MD for the patient is FALGUI,VICENTE T, MD  LOS - 2   Records from [facility] reviewed.     Admission history of present illness/brief narrative:  Kristin Dickson is a 81 y.o. female with history of dementia, CAD, chronic kidney disease, hypertension, chronic anemia, hypernatremia, was originally admitted at [facility] after PEG tube dislodged.  had a new PEG tube placed which was found to have a leak and eventually GI was consulted and a G to J-tube was placed. Despite which patient  was still found to have of copious gastric leakage around the tube site and via G-port, which is draining to foley bag.  reglan started without much improvement. General surgery consulted [facility], to see whether new feeding tube site could be placed.  They deemed patient not a surgical candidate.  Physicians at [facility] approached family about transitioning to comfort care, but family not receptive.  Patient transferred to [facility] for further management may need new gastric tube site placement and surgical closure. It is unclear to me whether transferring MD discussed with surgery prior to transfer. Nevertheless, surgery consulted today and recommends consulting GI to see whether tube could be exchanged, and also WOC eval (already consulted).  Admitting MD also consulted palliative care for goals of care, but they have not yet consulted  Assessment & Plan   Leaking PEG tube: Surgery recommends GI consult. See above.  WOC consult pending Will resume reglan and see whether G tube output improved. Discussed with Dr. Dulce Sellarutlaw, who recommends discussing with IR. Disussed with Dr. Deanne CofferHassell who  recommends checking placement under fluoro tomorrow to see if the bumper is malpositioned or something else that may be relatively easy to fix.  Hypertension - Continue with metoprolol  Chronic anemia hgb dropped today Anemia panel pending. Hold heparin and ASA. SCDs only. Monitor. Hemoccult stools  Chronic kidney disease stage III Creatinine up. Resume  D5w  Hypokalemia: correct and check mag  Coronary artery disease stable  Dementia Pt is bedridden, contractures.  Prognosis guarded. Left message with palliative medicine.  Reasonable goals of care meeting would be helpful, but not essential prior to discharge  Code Status: DO NOT RESUSCITATE  Family Communication: None at bedside  Disposition Plan: back to SNF after IR evaluates tube  Procedures None  Consults   [facility] by phone IR  Medications  Scheduled Meds: . antiseptic oral rinse  7 mL Mouth Rinse BID  . aspirin  325 mg Per Tube Daily  . dorzolamide  1 drop Both Eyes TID  . heparin  5,000 Units Subcutaneous 3 times per day  . metoprolol tartrate  25 mg Oral BID   Continuous Infusions: . feeding supplement (NEPRO CARB STEADY) 1,000 mL (06/20/14 0116)   PRN Meds:.acetaminophen **OR** acetaminophen, hydrALAZINE, ondansetron **OR** ondansetron (ZOFRAN) IV  DVT Prophylaxis  heparin  Lab Results  Component Value Date   PLT 216 06/20/2014    Antibiotics    Anti-infectives    None      Subjective:   Per RN, minimal drainage around  tube site. No reported prolbems    Objective:   Filed Vitals:   06/18/14 2200 06/19/14 1315 06/19/14 2214 06/20/14 0535  BP: 134/79 146/64 129/63 140/50  Pulse: 101 84 97 92  Temp: 97.7 F (36.5 C) 98.6 F (37 C) 98.8 F (37.1 C) 98.8 F (37.1 C)  TempSrc: Axillary Oral Oral Axillary  Resp: Height:  (1.6 m)     Weight: 62.823 kg (138 lb 8 oz)     SpO2: 92% 100% 99% 100%    Wt Readings from Last 3 Encounters:  06/18/14 62.823 kg (138  lb 8 oz)     Intake/Output Summary (Last 24 hours) at 06/20/14 1146 Last data filed at 06/20/14 0536  Gross per 24 hour  Intake   1900 ml  Output   1100 ml  Net    800 ml     Physical Exam  Awake , noncommunicative  lungs CTAB without WRR CV RRR without Gallops,Rubs or new Murmurs, Abd: +ve B.Sounds, Abd Soft, No tenderness, No organomegaly appriciated, No rebound - guarding or rigidity, minimal drainage around tube site.  G port connected to foley bag half full of bilious contents Ext: contractures. No edema   Data Review   Micro Results No results found for this or any previous visit (from the past 240 hour(s)).  Radiology Reports Dg Abd 1 View  06/19/2014   CLINICAL DATA:  adjustment of gastrointestinal device. Initial encounter.  EXAM: ABDOMEN - 1 VIEW  COMPARISON:  None.  FINDINGS: There appears to be a percutaneous enteric tube which has a course from the region of the stomach, through the doudenal C loop and terminating in the right abdomen, presumably in small bowel.  There is oral contrast mixed with stool. Non obstructed bowel gas pattern. Two IVC filters are in place. Negative lung bases. No acute osseous abnormality identified.  IMPRESSION: 1. Enteric tubing favored to be a percutaneous G-J tube, coursing through the duodenum and terminating in the proximal small bowel in the right abdomen.  2.  Non obstructed bowel gas pattern.   Electronically Signed   By: Odessa Fleming M.D.   On: 06/19/2014 02:24   Dg Chest Port 1 View  06/19/2014   CLINICAL DATA:  Pre-op respiratory exam. Coronary artery disease. Chronic kidney disease. Leaking gastrostomy tube.  EXAM: PORTABLE CHEST - 1 VIEW  COMPARISON:  None.  FINDINGS: Position of the patient's head obscures the right upper lung field. Lungs are otherwise clear. No evidence of pleural effusion. Heart size is within normal limits.  IMPRESSION: Suboptimal visualization of right upper lung field due to positioning. No acute findings.    Electronically Signed   By: Myles Rosenthal M.D.   On: 06/19/2014 11:08    CBC  Recent Labs Lab 06/19/14 0234 06/20/14 0643  WBC 4.8  5.0 5.3  HGB 7.9*  8.1* 7.6*  HCT 25.9*  26.6* 25.2*  PLT 194  185 216  MCV 92.8  93.7 93.7  MCH 28.3  28.5 28.3  MCHC 30.5  30.5 30.2  RDW 14.2  14.4 14.6  LYMPHSABS 1.6  --   MONOABS 0.4  --   EOSABS 0.2  --   BASOSABS 0.0  --     Chemistries   Recent Labs Lab 06/19/14 0234 06/20/14 0643  NA 146*  146* 144  K 3.8  3.7 3.1*  CL 112  110 107  CO2 GLUCOSE 101*  99 105*  BUN 22  21 32*  CREATININE 1.87*  1.87* 2.06*  CALCIUM 8.7  8.7 8.7  AST 26  --   ALT 25  --   ALKPHOS 47  --   BILITOT 0.6  --    ------------------------------------------------------------------------------------------------------------------ estimated creatinine clearance is 15.3 mL/min (by C-G formula based on Cr of 2.06). ------------------------------------------------------------------------------------------------------------------ No results for input(s): HGBA1C in the last 72 hours. ------------------------------------------------------------------------------------------------------------------ No results for input(s): CHOL, HDL, LDLCALC, TRIG, CHOLHDL, LDLDIRECT in the last 72 hours. ------------------------------------------------------------------------------------------------------------------ No results for input(s): TSH, T4TOTAL, T3FREE, THYROIDAB in the last 72 hours.  Invalid input(s): FREET3 ------------------------------------------------------------------------------------------------------------------  Recent Labs  06/19/14 1618  RETICCTPCT 2.4    Coagulation profile  Recent Labs Lab 06/19/14 0234  INR 1.02    No results for input(s): DDIMER in the last 72 hours.  Cardiac Enzymes No results for input(s): CKMB, TROPONINI, MYOGLOBIN in the last 168 hours.  Invalid input(s):  CK ------------------------------------------------------------------------------------------------------------------ Invalid input(s): POCBNP  Time Spent in minutes   45 minutes >50% coordinating care, calling specialists, reviewing outpatient records   Christiane Ha M.D on 06/20/2014 at 11:46 AM  [email or web address] - password [facility]

## 2014-06-20 NOTE — Progress Notes (Signed)
Palliative Medicine Team consult received- will coordinate with family and see this patient at the earliest possible time we have a provider available.  Anderson MaltaElizabeth Jarrid Lienhard, DO Palliative Medicine [phone]

## 2014-06-21 ENCOUNTER — Inpatient Hospital Stay (HOSPITAL_COMMUNITY): Payer: Medicare Other

## 2014-06-21 DIAGNOSIS — Z66 Do not resuscitate: Secondary | ICD-10-CM | POA: Diagnosis present

## 2014-06-21 DIAGNOSIS — D638 Anemia in other chronic diseases classified elsewhere: Secondary | ICD-10-CM

## 2014-06-21 DIAGNOSIS — Z515 Encounter for palliative care: Secondary | ICD-10-CM

## 2014-06-21 LAB — CBC
HCT: 28.2 % — ABNORMAL LOW (ref 36.0–46.0)
Hemoglobin: 8.5 g/dL — ABNORMAL LOW (ref 12.0–15.0)
MCH: 28.1 pg (ref 26.0–34.0)
MCHC: 30.1 g/dL (ref 30.0–36.0)
MCV: 93.4 fL (ref 78.0–100.0)
PLATELETS: 234 10*3/uL (ref 150–400)
RBC: 3.02 MIL/uL — ABNORMAL LOW (ref 3.87–5.11)
RDW: 14.5 % (ref 11.5–15.5)
WBC: 5.3 10*3/uL (ref 4.0–10.5)

## 2014-06-21 LAB — BASIC METABOLIC PANEL
Anion gap: 7 (ref 5–15)
BUN: 33 mg/dL — AB (ref 6–23)
CO2: 35 mmol/L — AB (ref 19–32)
Calcium: 8.7 mg/dL (ref 8.4–10.5)
Chloride: 104 mmol/L (ref 96–112)
Creatinine, Ser: 2.07 mg/dL — ABNORMAL HIGH (ref 0.50–1.10)
GFR calc Af Amer: 23 mL/min — ABNORMAL LOW (ref >90)
GFR, EST NON AFRICAN AMERICAN: 20 mL/min — AB (ref >90)
Glucose, Bld: 129 mg/dL — ABNORMAL HIGH (ref 70–99)
Potassium: 3.7 mmol/L (ref 3.5–5.1)
Sodium: 146 mmol/L — ABNORMAL HIGH (ref 135–145)

## 2014-06-21 LAB — GLUCOSE, CAPILLARY
GLUCOSE-CAPILLARY: 128 mg/dL — AB (ref 70–99)
Glucose-Capillary: 111 mg/dL — ABNORMAL HIGH (ref 70–99)
Glucose-Capillary: 120 mg/dL — ABNORMAL HIGH (ref 70–99)
Glucose-Capillary: 131 mg/dL — ABNORMAL HIGH (ref 70–99)
Glucose-Capillary: 133 mg/dL — ABNORMAL HIGH (ref 70–99)

## 2014-06-21 MED ORDER — DEXTROSE-NACL 5-0.45 % IV SOLN
INTRAVENOUS | Status: DC
  Administered 2014-06-21 – 2014-06-22 (×2): via INTRAVENOUS

## 2014-06-21 MED ORDER — IOHEXOL 300 MG/ML  SOLN
50.0000 mL | Freq: Once | INTRAMUSCULAR | Status: AC | PRN
  Administered 2014-06-21: 10 mL

## 2014-06-21 NOTE — Progress Notes (Signed)
Patient Demographics  Kristin Dickson, is a 81 y.o. female, DOB - 05/24/1922, ZOX:[id number]  Admit date - 06/18/2014   Admitting Physician Dorothea OgleIskra M Myers, MD  Outpatient Primary MD for the patient is FALGUI,VICENTE T, MD  LOS - 3   [facility].     Admission history of present illness/brief narrative:  Kristin Dickson is a [age] y.o. female with history of dementia, CAD, chronic kidney disease, hypertension, chronic anemia, hypernatremia, was [facility] admitted [facility] after PEG tube dislodged.  had a new PEG tube placed which was found to have a leak and eventually GI was consulted and a G to J-tube was placed. Despite which patient  was still found to have of copious gastric leakage around the tube site and via G-port, which is draining to foley bag.  reglan started without much improvement. General surgery consulted at [facility], to see whether new feeding tube site could be placed.  They deemed patient not a surgical candidate.  [facility] approached family about transitioning to comfort care, but family not receptive.  [facility] transferred [facility] for further management may need new gastric tube site placement and surgical closure. It is unclear to me whether transferring MD discussed with surgery prior to transfer. Nevertheless, surgery consulted today and recommends consulting GI to see whether tube could be exchanged, and also WOC eval (already consulted).  Admitting MD also consulted palliative care for goals of care, but they have not yet consulted  Assessment & Plan   Leaking PEG tube: WOC consult appreciated Started on reglan IR to check tube placement under fluoro today Continue D5W for now.  Hypertension - Continue metoprolol  Chronic anemia hgb dropped today Anemia panel pending. Hold  heparin and ASA. SCDs only. Monitor. Hemoccult stools  Chronic kidney disease stage III Creatinine unchanged. Continue D5W and monitor  Hypokalemia: corrected. Mag ok  Coronary artery disease stable  Dementia Pt is bedridden, contractures.  Prognosis guarded. PMT consult deferred. Will need palliative consult at SNF  Code Status: DO NOT RESUSCITATE  Family Communication: None at bedside  Disposition Plan: back to SNF after IR evaluates tube  Procedures None  Consults   [facility] by phone IR  Medications  Scheduled Meds: . antiseptic oral rinse  7 mL Mouth Rinse BID  . dorzolamide  1 drop Both Eyes TID  . metoCLOPramide  5 mg Oral Q6H  . metoprolol tartrate  25 mg Oral BID  . potassium chloride  40 mEq Per Tube QID   Continuous Infusions: . feeding supplement (NEPRO CARB STEADY) 1,000 mL (06/20/14 2134)   PRN Meds:.acetaminophen **OR** acetaminophen, hydrALAZINE, ondansetron **OR** ondansetron (ZOFRAN) IV  DVT Prophylaxis  heparin  Lab Results  Component Value Date   PLT 234 06/21/2014    Antibiotics    Anti-infectives    None      Subjective:   No new issues reported   Objective:   Filed Vitals:   06/20/14 1310 06/20/14 2231 06/21/14 0608 06/21/14 1014  BP: 164/60 166/62 141/68   Pulse: 69 66 88 96  Temp: 98 F (36.7 C) 98.9 F (37.2 C) 97.6 F (36.4 C)   TempSrc: Oral Oral Axillary   Resp: 16 14  16   Height:      Weight:      SpO2: 100% 100% 97%     Wt Readings from Last 3 Encounters:  06/18/14 62.823 kg (138 lb 8 oz)     Intake/Output Summary (Last 24 hours) at 06/21/14 1243 Last data filed at 06/21/14 1030  Gross per 24 hour  Intake 2359.09 ml  Output   4150 ml  Net -1790.91 ml     Physical Exam  Asleep. comfortable lungs CTAB without WRR CV RRR without Gallops,Rubs or new Murmurs, Abd: +ve B.Sounds, Abd Soft, No tenderness, No organomegaly appriciated, No rebound - guarding or rigidity, minimal drainage around tube  site.  G port connected to foley bag. Drainage slowing? Ext: contractures. No edema   Data Review   Micro Results No results found for this or any previous visit (from the past 240 hour(s)).  Radiology Reports No results found.  CBC  Recent Labs Lab 06/19/14 0234 06/20/14 0643 06/21/14 0508  WBC 4.8  5.0 5.3 5.3  HGB 7.9*  8.1* 7.6* 8.5*  HCT 25.9*  26.6* 25.2* 28.2*  PLT 194  185 216 234  MCV 92.8  93.7 93.7 93.4  MCH 28.3  28.5 28.3 28.1  MCHC 30.5  30.5 30.2 30.1  RDW 14.2  14.4 14.6 14.5  LYMPHSABS 1.6  --   --   MONOABS 0.4  --   --   EOSABS 0.2  --   --   BASOSABS 0.0  --   --     Chemistries   Recent Labs Lab 06/19/14 0234 06/20/14 0643 06/20/14 1415 06/21/14 0508  NA 146*  146* 144  --  146*  K 3.8  3.7 3.1*  --  3.7  CL 112  110 107  --  104  CO2 --  35*  GLUCOSE 101*  99 105*  --  129*  BUN 22  21 32*  --  33*  CREATININE 1.87*  1.87* 2.06*  --  2.07*  CALCIUM 8.7  8.7 8.7  --  8.7  MG  --   --  2.3  --   AST 26  --   --   --   ALT 25  --   --   --   ALKPHOS 47  --   --   --   BILITOT 0.6  --   --   --    ------------------------------------------------------------------------------------------------------------------ estimated creatinine clearance is 15.2 mL/min (by C-G formula based on Cr of 2.07). ------------------------------------------------------------------------------------------------------------------ No results for input(s): HGBA1C in the last 72 hours. ------------------------------------------------------------------------------------------------------------------ No results for input(s): CHOL, HDL, LDLCALC, TRIG, CHOLHDL, LDLDIRECT in the last 72 hours. ------------------------------------------------------------------------------------------------------------------ No results for input(s): TSH, T4TOTAL, T3FREE, THYROIDAB in the last 72 hours.  Invalid input(s):  FREET3 ------------------------------------------------------------------------------------------------------------------  Recent Labs  06/19/14 1618  VITAMINB12 1954*  FOLATE >20.0  FERRITIN 434*  TIBC 188*  IRON 59  RETICCTPCT 2.4    Coagulation profile  Recent Labs Lab 06/19/14 0234  INR 1.02    No results for input(s): DDIMER in the last 72 hours.  Cardiac Enzymes No results for input(s): CKMB, TROPONINI, MYOGLOBIN in the last 168 hours.  Invalid input(s): CK ------------------------------------------------------------------------------------------------------------------ Invalid input(s): POCBNP  Time Spent in minutes   45 minutes >50% coordinating care, calling specialists, reviewing outpatient records   Christiane Ha M.D on 06/21/2014 at 12:43 PM  [email or web address] - password [facility]

## 2014-06-21 NOTE — Progress Notes (Signed)
Noted plans for discharge to [facility], recommend outpatient palliative consultation at SNF if this service is available in that region. Patient is DNR.  Would benefit from MOST form-family would need to be available. No urgent PMT needs currently.  Kristin MaltaElizabeth Golding, DO Palliative Medicine [phone]

## 2014-06-21 NOTE — Clinical Social Work Note (Signed)
CSW received referral regarding patient admitted from [facility] [place]. CSW attempted to meet with patient's family at bedside. No family present. CSW to continue to follow and assist with patient return to SNF in [place], [place] once patient medically stable for discharge.  Marcelline Deistmily Aleli Navedo, LCSWA [phone]) Licensed Clinical Social Worker Orthopedics [phone]([id number]) and Surgical [phone]([id number])

## 2014-06-21 NOTE — Consult Note (Signed)
WOC wound consult note Reason for Consult: Leaking PEG tube site.  Denuded skin present.  Wound type: Moisture Associated Skin Damage, full thickness, from PEG tube effluent.   Measurement: Denuded skin from 5 to 9 o'clock and an additional denuded area extending 2 cm left of umbilicus at 7 o'clock.  Serous drainage from denuded skin, but creamy effluent from PEG tube site noted.  Wound bed: 100% beefy red and moist.  Drainage (amount, consistency, odor) Minimal serous drainage from wound.  Moderate creamy effluent from PEG site.  Periwound: Denuded Dressing procedure/placement/frequency: Cleanse PEG tube site with NS daily.  Apply barrier ring to circumferential breakdown and the distal area of breakdown at 7 o'clock.  Hart [place] # [phone]).  Top with drain sponge.  Change daily.  Will not follow at this time.  Please re-consult if needed.  Maple HudsonKaren Keneshia Tena RN BSN CWON Pager [phone]

## 2014-06-22 DIAGNOSIS — Z66 Do not resuscitate: Secondary | ICD-10-CM

## 2014-06-22 LAB — BASIC METABOLIC PANEL
Anion gap: 6 (ref 5–15)
BUN: 38 mg/dL — AB (ref 6–23)
CALCIUM: 8.7 mg/dL (ref 8.4–10.5)
CO2: 33 mmol/L — ABNORMAL HIGH (ref 19–32)
Chloride: 101 mmol/L (ref 96–112)
Creatinine, Ser: 2.09 mg/dL — ABNORMAL HIGH (ref 0.50–1.10)
GFR calc Af Amer: 23 mL/min — ABNORMAL LOW (ref >90)
GFR, EST NON AFRICAN AMERICAN: 20 mL/min — AB (ref >90)
Glucose, Bld: 122 mg/dL — ABNORMAL HIGH (ref 70–99)
Potassium: 5 mmol/L (ref 3.5–5.1)
SODIUM: 140 mmol/L (ref 135–145)

## 2014-06-22 LAB — GLUCOSE, CAPILLARY
GLUCOSE-CAPILLARY: 118 mg/dL — AB (ref 70–99)
GLUCOSE-CAPILLARY: 114 mg/dL — AB (ref 70–99)
GLUCOSE-CAPILLARY: 96 mg/dL (ref 70–99)

## 2014-06-22 MED ORDER — NEPRO/CARBSTEADY PO LIQD: 1000.0000 mL | ORAL | Status: AC

## 2014-06-22 MED ORDER — METOCLOPRAMIDE HCL 5 MG/5ML PO SOLN: 5.0000 mg | Freq: Four times a day (QID) | ORAL | Status: AC

## 2014-06-22 NOTE — Progress Notes (Signed)
Report called to [facility] [place]; given to [place]. Patient in route via [facility] to facility. All documents sent with patient.

## 2014-06-22 NOTE — Discharge Summary (Signed)
Physician Discharge Summary  Kristin Dickson BJY:[id number] DOB: May 02, 1923 DOA: 06/18/2014  PCP: Leone Payor, MD  Admit date: 06/18/2014 Discharge date: 06/22/2014  Time spent: greater than 30 minutes  Recommendations for Outpatient Follow-up:  1. Palliative care consult for goals of care.  2. Monitor BMET and Gtube output. Increase free water per Jtube port as needed 3. Back to SNF in [place] 4. Keep G tube hooked to drainage bag  Discharge Diagnoses:  Principal Problem:   Leaking percutaneous endoscopic G-J tube. Active Problems:   Essential hypertension   CAD (coronary artery disease)   CKD (chronic kidney disease)   Chronic anemia   Dementia   Anemia   Hypokalemia   Chronic kidney disease (CKD), stage IV (severe)   DNR (do not resuscitate) hypernatremia  Discharge Condition: stable  Diet recommendation: NPO  Filed Weights   06/18/14 2200  Weight: 62.823 kg (138 lb 8 oz)    History of present illness/Hospital Course:  Kristin Dickson is a 81 y.o. female with history of dementia, CAD, chronic kidney disease, hypertension, chronic anemia, hypernatremia, was originally admitted at [facility] after PEG tube dislodged. had a new PEG tube placed which was found to have a leak and eventually GI was consulted and a G to J-tube was placed. Despite which patient was still found to have of copious gastric leakage around the tube site and via G-port, which is draining to foley bag. reglan started without much improvement. General surgery consulted at [facility], to see whether new feeding tube site could be placed. They deemed patient not a surgical candidate. Physicians at [facility] approached family about transitioning to comfort care, but family not receptive. Patient transferred to [facility] for further management may need new gastric tube site placement and surgical closure. It is unclear to me whether transferring MD discussed with surgery prior to  transfer. Nevertheless, surgery consulted today and recommends consulting GI to see whether tube could be exchanged, and also WOC. Case discussed with GI who recommended to be evaluated by interventional radiology. The tube was evaluated under fluoroscopy and noted to be well situated. No change was recommended by IR. Admitting MD also consulted palliative care for goals of care.  Palliative medicine team recommends palliative consult at skilled nursing facility in [place].  Leaking PEG tube: G-tube portion had been hooked to Foley bag at transfer and has high output, about 2 L of bilious gastric contents but no tube feeds. Reglan was resumed. Minimal drainage noted about the tube site. Wound care consulted and recommended cleaning with saline solution daily, Apply barrier ring to circumferential breakdown and the distal area of breakdown at 7 o'clock. Hart [place] # [phone]). Top with drain sponge. Change daily.  Patient was tolerating tube feeds via the J-tube port. Was hyponatremic on admission and received D5W during hospitalization.  Interventional radiology evaluated the tube under fluoroscopy:   "Well-positioned, intact and normally functioning gastrojejunostomy tube. Contrast injection through the J arm confirms location in the proximal small bowel with expected antegrade progression of contrast material. No significant reflux into the stomach.  PLAN: Exchange for a new device unlikely to provide benefit at this time. Recommend continued use of Foley bag for gravity based decompression of the stomach. Giving stomach contents a path of least resistance other than out around the tube should minimize leaking and allow for skin healing."   As patient has high G-tube output, keeping ins and outs will be the major challenge. I have recommended freewater per J-tube port 250  mL every 4 hours but will likely need to be increased. Prognosis in this bedbound chronically debilitated patient with  contractures is not good. I attempted to contact family to share my impression with them, as the [facility] physicians have already. Unable to contact them. Recommend palliative/goals of care discussion at skilled nursing facility.  Hypertension Remained stable  Chronic anemia No evidence of bleeding.  Anemia panel consistent with anemia of chronic disease.  Chronic kidney disease stage III  Hypokalemia: corrected. Mag ok  Coronary artery disease stable  Dementia Pt is bedridden, contractures. Prognosis guarded. PMT consult deferred. Will need palliative consult at SNF  Code Status: DO NOT RESUSCITATE  Family Communication: Called son and daughter. Wrong numbers.  Procedures None  Consults  [facility] by phone IR WOC  Discharge Exam: Filed Vitals:   06/22/14 0502  BP: 152/62  Pulse: 90  Temp: 97.8 F (36.6 C)  Resp: 16    General: asleep. Arousable. Answers questions.  Cardiovascular: regular rate rhythm without murmurs gallops rubs  Respiratory: clear to auscultation bilaterally without wheezes rhonchi or rales   abdomen: Soft nontender. PEG tube site dressing clean dry and intact. Extremities: No edema. Contractures noted.  Discharge Instructions   Discharge Instructions    Bed rest    Complete by:  As directed      Discharge instructions    Complete by:  As directed   Free water 250 cc per J tube q4 hours.  Palliative care to follow at SNF.  Cleanse PEG tube site with NS daily. Apply barrier ring to circumferential breakdown and the distal area of breakdown at 7 o'clock. Hart [place] # [phone]). Top with drain sponge. Change daily.          Current Discharge Medication List    START taking these medications   Details  metoCLOPramide (REGLAN) 5 MG/5ML solution Take 5 mLs (5 mg total) by mouth every 6 (six) hours. Qty: 120 mL, Refills: 0    Nutritional Supplements (FEEDING SUPPLEMENT, NEPRO CARB STEADY,) LIQD Place 1,000 mLs into feeding tube  continuous. Refills: 0      CONTINUE these medications which have NOT CHANGED   Details  acetaminophen (TYLENOL) 325 MG tablet Take 650 mg by mouth every 6 (six) hours as needed (for pain).    amLODipine (NORVASC) 10 MG tablet 10 mg by PEG Tube route daily.    aspirin 81 MG tablet Take 81 mg by mouth daily.    carvedilol (COREG) 12.5 MG tablet 12.5 mg by PEG Tube route 2 (two) times daily with a meal. Hold for sbp<100 or hr<60    dorzolamide (TRUSOPT) 2 % ophthalmic solution Place 1 drop into both eyes 3 (three) times daily.    ferrous sulfate 300 (60 FE) MG/5ML syrup 300 mg by PEG Tube route 3 (three) times daily with meals.    omeprazole (PRILOSEC) 20 MG capsule Take 20 mg by mouth 2 (two) times daily before a meal.    polyethylene glycol (MIRALAX / GLYCOLAX) packet 17 g by PEG Tube route daily.      STOP taking these medications     Aspirin-Salicylamide-Caffeine (BC HEADACHE POWDER PO)      bacitracin ointment      hydrALAZINE (APRESOLINE) 50 MG tablet        No Known Allergies Follow-up Information    Follow up with FALGUI,VICENTE T, MD.   Specialty:  Internal Medicine   Why:  within one week.       The results of  significant diagnostics from this hospitalization (including imaging, microbiology, ancillary and laboratory) are listed below for reference.    Significant Diagnostic Studies: Dg Abd 1 View  06/19/2014   CLINICAL DATA:  adjustment of gastrointestinal device. Initial encounter.  EXAM: ABDOMEN - 1 VIEW  COMPARISON:  None.  FINDINGS: There appears to be a percutaneous enteric tube which has a course from the region of the stomach, through the doudenal C loop and terminating in the right abdomen, presumably in small bowel.  There is oral contrast mixed with stool. Non obstructed bowel gas pattern. Two IVC filters are in place. Negative lung bases. No acute osseous abnormality identified.  IMPRESSION: 1. Enteric tubing favored to be a percutaneous G-J tube,  coursing through the duodenum and terminating in the proximal small bowel in the right abdomen.  2.  Non obstructed bowel gas pattern.   Electronically Signed   By: Odessa Fleming M.D.   On: 06/19/2014 02:24   Ir Cm Inj Any Colonic Tube W/fluoro  06/21/2014   CLINICAL DATA:  81 year old female with a gastrojejunostomy tube and leaking at the gastrostomy tube site.  EXAM: GI TUBE INJECTION  Date: 06/21/2014  PROCEDURE: 1. Evaluation of gastro jejunostomy tube with injection under fluoroscopy Interventional Radiologist:  Sterling Big, MD  ANESTHESIA/SEDATION: None required  MEDICATIONS: None  FLUOROSCOPY TIME:  42 seconds  58 mGy  CONTRAST:  10mL OMNIPAQUE IOHEXOL 300 MG/ML  SOLN  TECHNIQUE: Informed consent was obtained from the patient following explanation of the procedure, risks, benefits and alternatives. The patient understands, agrees and consents for the procedure. All questions were addressed. A time out was performed.  The tube was inspected. There is no active leak at this time. Packing body is present on the skin surface. The gastrostomy port is connected to a Foley bag for gravity drainage. Feeds are instilling through the jejunostomy tube port. The tube is intact without evidence of cracking or breakdown.  Multiple images of the gastrojejunostomy tube are obtained in varying projections. A gentle hand injection of contrast material through the jejunal limb confirms placement within the proximal small bowel. Contrast movement is antegrade. There is no significant reflux into the stomach.  COMPLICATIONS: None  IMPRESSION: 1. Well-positioned, intact and normally functioning gastrojejunostomy tube. Contrast injection through the J arm confirms location in the proximal small bowel with expected antegrade progression of contrast material. No significant reflux into the stomach.  PLAN: Exchange for a new device unlikely to provide benefit at this time. Recommend continued use of Foley bag for gravity based  decompression of the stomach. Giving stomach contents a path of least resistance other than out around the tube should minimize leaking and allow for skin healing.  Signed,  Sterling Big, MD  [facility]  [facility]   Electronically Signed   By: Malachy Moan M.D.   On: 06/21/2014 16:00   Dg Chest Port 1 View  06/19/2014   CLINICAL DATA:  Pre-op respiratory exam. Coronary artery disease. Chronic kidney disease. Leaking gastrostomy tube.  EXAM: PORTABLE CHEST - 1 VIEW  COMPARISON:  None.  FINDINGS: Position of the patient's head obscures the right upper lung field. Lungs are otherwise clear. No evidence of pleural effusion. Heart size is within normal limits.  IMPRESSION: Suboptimal visualization of right upper lung field due to positioning. No acute findings.   Electronically Signed   By: Myles Rosenthal M.D.   On: 06/19/2014 11:08    Microbiology: No results found for this  or any previous visit (from the past 240 hour(s)).   Labs: Basic Metabolic Panel:  Recent Labs Lab 06/19/14 0234 06/20/14 0643 06/20/14 1415 06/21/14 0508 06/22/14 0743  NA 146*  146* 144  --  146* 140  K 3.8  3.7 3.1*  --  3.7 5.0  CL 112  110 107  --  104 101  CO2 30  31 27   --  35* 33*  GLUCOSE 101*  99 105*  --  129* 122*  BUN 22  21 32*  --  33* 38*  CREATININE 1.87*  1.87* 2.06*  --  2.07* 2.09*  CALCIUM 8.7  8.7 8.7  --  8.7 8.7  MG  --   --  2.3  --   --    Liver Function Tests:  Recent Labs Lab 06/19/14 0234  AST 26  ALT 25  ALKPHOS 47  BILITOT 0.6  PROT 7.0  ALBUMIN 2.5*   No results for input(s): LIPASE, AMYLASE in the last 168 hours. No results for input(s): AMMONIA in the last 168 hours. CBC:  Recent Labs Lab 06/19/14 0234 06/20/14 0643 06/21/14 0508  WBC 4.8  5.0 5.3 5.3  NEUTROABS 2.8  --   --   HGB 7.9*  8.1* 7.6* 8.5*  HCT 25.9*  26.6* 25.2* 28.2*  MCV 92.8  93.7 93.7 93.4  PLT 194  185 216 234   Cardiac  Enzymes: No results for input(s): CKTOTAL, CKMB, CKMBINDEX, TROPONINI in the last 168 hours. BNP: BNP (last 3 results) No results for input(s): BNP in the last 8760 hours.  ProBNP (last 3 results) No results for input(s): PROBNP in the last 8760 hours.  CBG:  Recent Labs Lab 06/21/14 1233 06/21/14 1758 06/21/14 2331 06/22/14 0526 06/22/14 0625  GLUCAP 120* 133* 131* 118* 114*       Signed:  Johnda Billiot L  [facility] 06/22/2014, 10:56 AM

## 2014-06-22 NOTE — Progress Notes (Signed)
NUTRITION FOLLOW UP  Intervention:   -Continue Nepro @ at goal rate of 45 ml/hr via PEG -Tube feeding regimen provides 1944 kcal (100% of needs), 88 grams of protein, and 785 ml of H2O.   Nutrition Dx:   Inadequate oral intake related to inability to eat as evidenced by NPO; ongoing  Goal:   Pt will meet >90% of estimated nutritional needs; met  Monitor:   TF tolerance/management, labs, weight changes, I/O's  Assessment:   81 y.o. female with history of mild dementia, CAD, chronic kidney disease, hypertension, chronic anemia, hypernatremia. Had PEG leakage, New J-tube was placed  Pt remains on home TF regimen of Nepro @ 45 ml/hr which provides 1944 kcals, 88 grams protein, 785 mls fluid (which meets 100% of re-estimated kcal and protein needs). Pt continues to tolerate TF well with no recorded gastric residuals since last assessment.  Plan is for IR to evaluate PEG for possible exchange.  Palliative care has also been consulted for goals of care.  Pt will d/c back to SNF once medically stable.  Labs reviewed. CO2: 33, BUN: 38, Creat: 2.09, Glucose: 122, CBGS: 114-131. Mg and K WDL.   Height: Ht Readings from Last 1 Encounters:  06/18/14 5' 3"  (1.6 m)    Weight Status:   Wt Readings from Last 1 Encounters:  06/18/14 138 lb 8 oz (62.823 kg)    Re-estimated needs:  Kcal: 1800-2000 Protein: 80-90 grams Fluid: 1.8-2.0 L  Skin: stage II pressure ulcer on rt heel, unstageable pressure ulcer on lt heel  Diet Order:     Intake/Output Summary (Last 24 hours) at 06/22/14 1026 Last data filed at 06/22/14 0503  Gross per 24 hour  Intake 2989.83 ml  Output   2025 ml  Net 964.83 ml    Last BM: 06/21/14   Labs:   Recent Labs Lab 06/20/14 0643 06/20/14 1415 06/21/14 0508 06/22/14 0743  NA 144  --  146* 140  K 3.1*  --  3.7 5.0  CL 107  --  104 101  CO2 27  --  35* 33*  BUN 32*  --  33* 38*  CREATININE 2.06*  --  2.07* 2.09*  CALCIUM 8.7  --  8.7 8.7  MG  --  2.3   --   --   GLUCOSE 105*  --  129* 122*    CBG (last 3)   Recent Labs  06/21/14 2331 06/22/14 0526 06/22/14 0625  GLUCAP 131* 118* 114*    Scheduled Meds: . antiseptic oral rinse  7 mL Mouth Rinse BID  . dorzolamide  1 drop Both Eyes TID  . metoCLOPramide  5 mg Oral Q6H  . metoprolol tartrate  25 mg Oral BID    Continuous Infusions: . dextrose 5 % and 0.45% NaCl 100 mL/hr at 06/22/14 0207  . feeding supplement (NEPRO CARB STEADY) 1,000 mL (06/21/14 2353)    Paradise Vensel A. Jimmye Norman, RD, LDN, CDE Pager: [phone] After hours Pager: [phone]

## 2014-06-22 NOTE — Discharge Planning (Signed)
Patient to be discharged back [facility] [place]. Patient's son, Karalee HeightLarry Resch, updated regarding discharge.  Facility: [facility] Report number: [phone] Transportation: EMS ([facility]) scheduled at noon, however, EMS states patient will be transported later in the afternoon due to high volume of out-of-town transfers on 06/22/2014.  Marcelline Deistmily Cope Marte, LCSWA [phone]) Licensed Clinical Social Worker Orthopedics [phone]([id number]) and Surgical [phone]([id number])

## 2014-06-22 NOTE — Clinical Social Work Psychosocial (Signed)
Clinical Social Work Department BRIEF PSYCHOSOCIAL ASSESSMENT 06/22/2014  Patient:  Kristin Dickson,Kristin Dickson     Account Number:  [id number]     Admit date:  06/18/2014  Clinical Social Worker:  Hortencia PilarWILEY,KIERRA, CLINICAL SOCIAL WORKER  Date/Time:  06/22/2014 09:27 AM  Referred by:  Physician  Date Referred:  06/22/2014 Referred for  SNF Placement   Other Referral:   none.   Interview type:  Family Other interview type:   none.    PSYCHOSOCIAL DATA Living Status:  FACILITY Admitted from facility[facility] Level of care:  Skilled Nursing Facility Primary support name:  Kristin Dickson Primary support relationship to patient:  CHILD, ADULT Degree of support available:   Adequate support.    CURRENT CONCERNS Current Concerns  Post-Acute Placement   Other Concerns:   none.    SOCIAL WORK ASSESSMENT / PLAN CSW and BSW-Intern consulted regarding SNF placement for pt once medically stable for discharge.    BSW-Intern spoke with pt's son Kristin Najjar(Larry) via phone to confirm that pt would be returning to [facility] in [place], [place] once medically stable for discharge. Pt's son informed BSW-Intern that pt has been a [facility] at [facility] since last fall. Pt's family is awaiting the return of pt to [facility] once pt is medically stable for discharge.    CSW and BSW-Intern to continue to follow and assist with discharge planning needs.   Assessment/plan status:  Psychosocial Support/Ongoing Assessment of Needs Other assessment/ plan:   none.   Information/referral to community resources:   Pt to be discharged to [facility] in [place], [place] once medially stable for discharge.    PATIENT'S/FAMILY'S RESPONSE TO PLAN OF CARE: Pt and pt's family agreeable and understanding of CSW plan of care. Pt or pt's family expressed no further questions or concerns at this.       Kristin Dickson, BSW-Intern   CSW has seen and agreed with the above  information.  Kristin Dickson, LCSWA [phone]) Licensed Clinical Social Worker Orthopedics [phone]([id number]) and Surgical [phone]([id number])

## 2014-07-06 DEATH — deceased

## 2015-10-10 IMAGING — CR DG CHEST 1V PORT
1 series · 1 of 1 positions shown · non-contrast
Comparison: None.

CLINICAL DATA: Pre-op respiratory exam. Coronary artery disease.
Chronic kidney disease. Leaking gastrostomy tube.

EXAM:
PORTABLE CHEST - 1 VIEW

[AP]
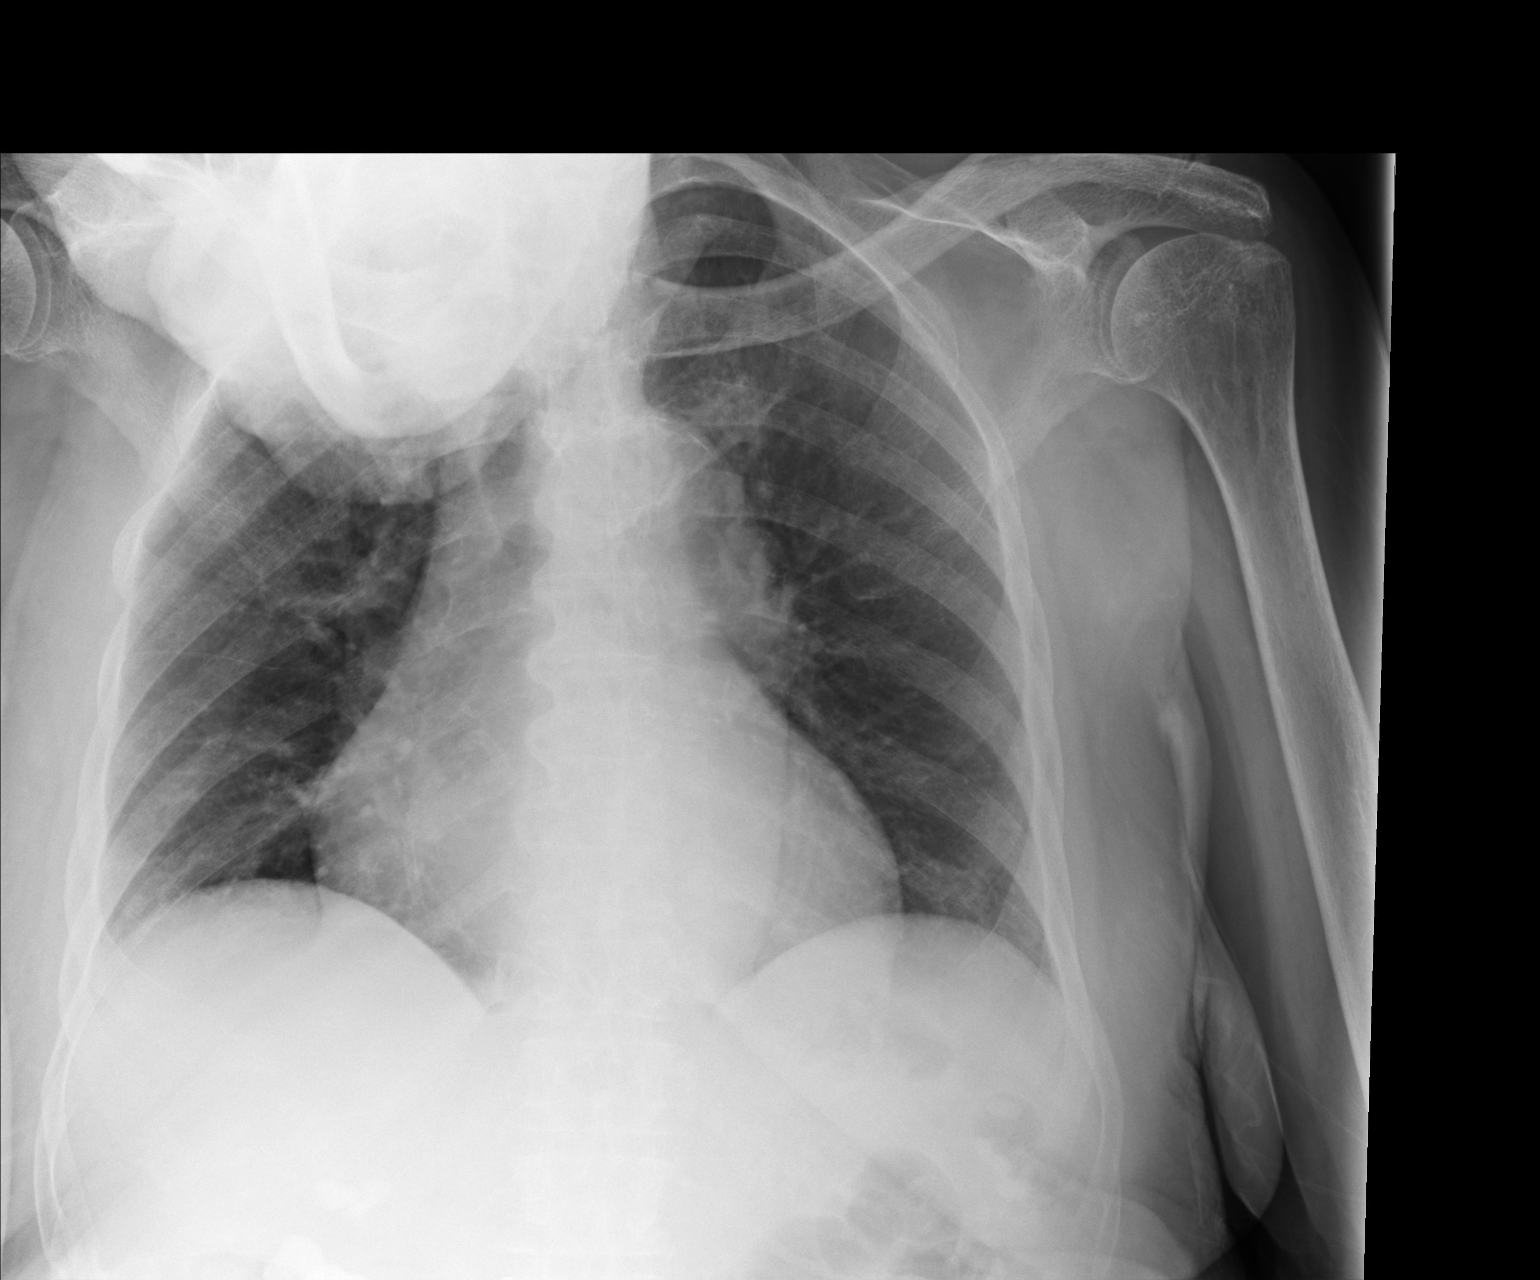

[1 of 1 positions shown; findings below may reference images not displayed]

FINDINGS: Position of the patient's head obscures the right upper lung field.
Lungs are otherwise clear. No evidence of pleural effusion. Heart
size is within normal limits.
IMPRESSION: Suboptimal visualization of right upper lung field due to
positioning. No acute findings.
